# Patient Record
Sex: Male | Born: 1975 | Race: White | Hispanic: No | Marital: Single
Health system: Southern US, Community
[De-identification: ages and names within clinical notes are randomized; demographics above are authoritative.]

---

## 2010-10-12 ENCOUNTER — Inpatient Hospital Stay (INDEPENDENT_AMBULATORY_CARE_PROVIDER_SITE_OTHER)
Admission: RE | Admit: 2010-10-12 | Discharge: 2010-10-12 | Disposition: A | Discharge status: Home / Self Care | Payer: Managed Care, Other (non HMO) | Source: Ambulatory Visit | Attending: Emergency Medicine | Admitting: Emergency Medicine

## 2010-10-12 DIAGNOSIS — M899 Disorder of bone, unspecified: Secondary | ICD-10-CM

## 2010-10-12 DIAGNOSIS — M949 Disorder of cartilage, unspecified: Secondary | ICD-10-CM

## 2011-02-16 ENCOUNTER — Ambulatory Visit
Admission: RE | Admit: 2011-02-16 | Discharge: 2011-02-16 | Disposition: A | Discharge status: Home / Self Care | Payer: Managed Care, Other (non HMO) | Source: Ambulatory Visit | Attending: Internal Medicine | Admitting: Internal Medicine

## 2011-02-16 ENCOUNTER — Other Ambulatory Visit: Payer: Self-pay | Admitting: Internal Medicine

## 2011-02-16 DIAGNOSIS — M545 Low back pain: Secondary | ICD-10-CM

## 2011-02-16 IMAGING — CR DG LUMBAR SPINE COMPLETE 4+V
5 series · 5 of 5 positions shown · non-contrast
Comparison: None.

CLINICAL DATA: 35-year-old male with low back pain, right leg pain.

LUMBAR SPINE - COMPLETE 4+ VIEW

[t l-spine a.p.]
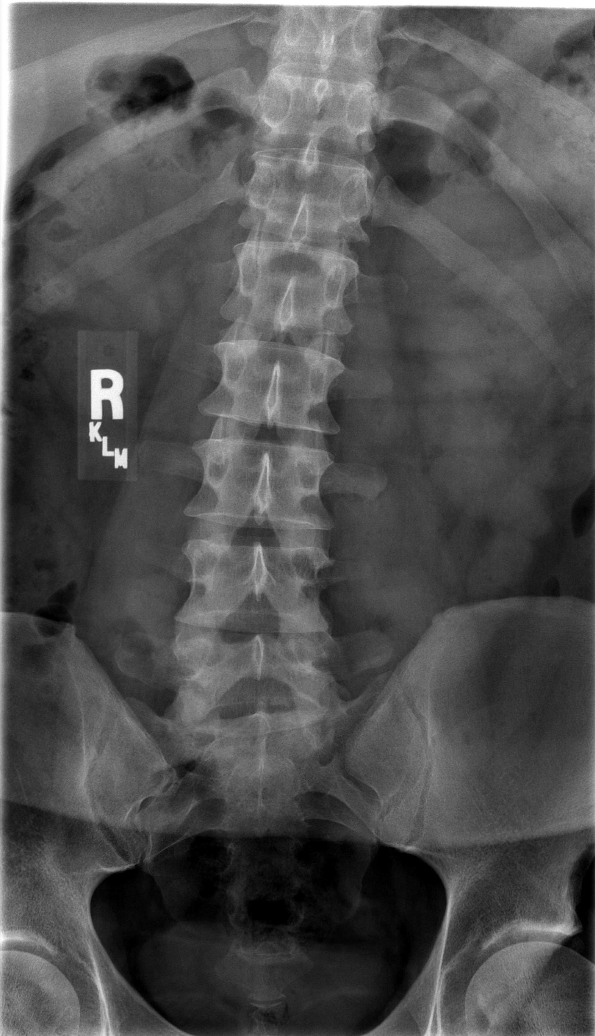

[t l-spine oblique exposure (1 of 2)]
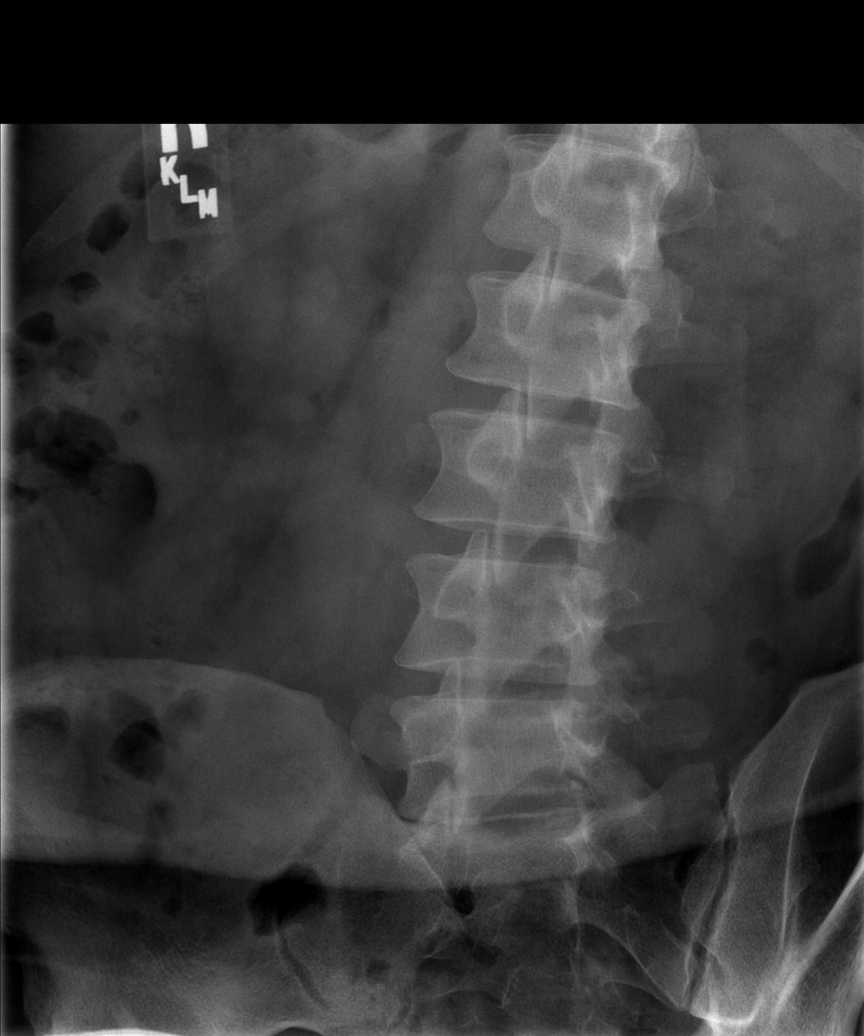

[t l-spine oblique exposure (2 of 2)]
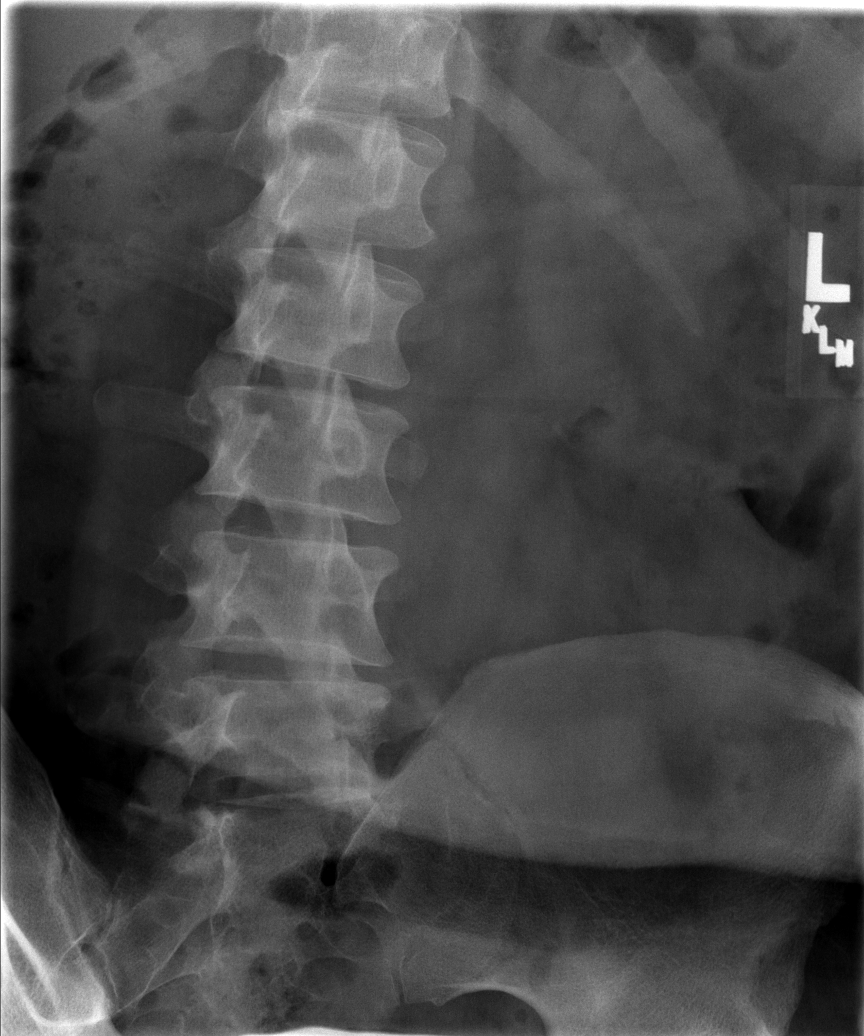

[t l-spine lat]
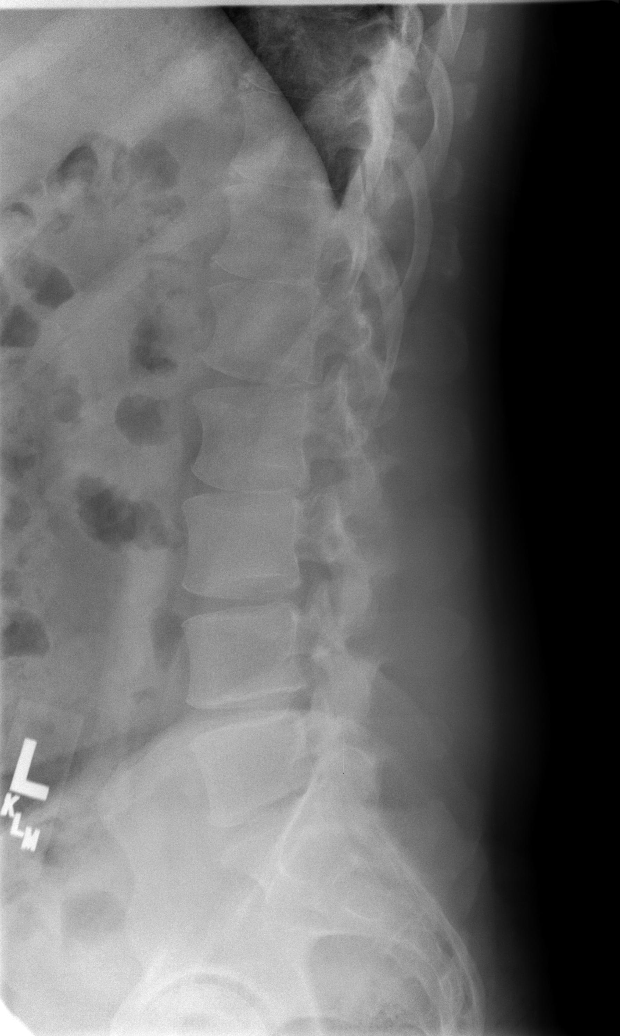

[t l-spine l5-s1 spot]
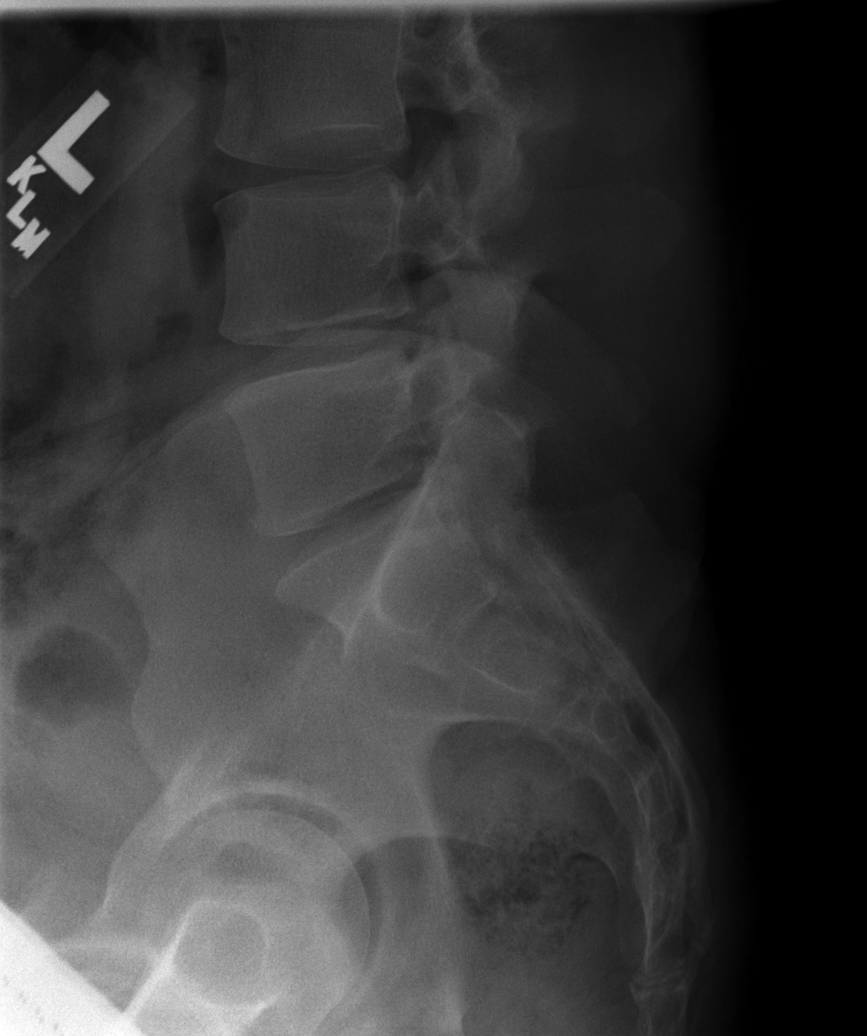

[5 of 5 positions shown; findings below may reference images not displayed]

FINDINGS: Normal lumbar segmentation. Bone mineralization is within
normal limits.  Normal lumbar vertebral height alignment.
Relatively preserved disc spaces.  No pars fracture.  Sacrum and SI
joints within normal limits.
IMPRESSION: Negative radiographic appearance of the lumbar spine.

## 2014-01-11 ENCOUNTER — Ambulatory Visit
Admission: RE | Admit: 2014-01-11 | Discharge: 2014-01-11 | Disposition: A | Discharge status: Home / Self Care | Payer: Managed Care, Other (non HMO) | Source: Ambulatory Visit | Attending: Internal Medicine | Admitting: Internal Medicine

## 2014-01-11 ENCOUNTER — Other Ambulatory Visit: Payer: Self-pay | Admitting: Internal Medicine

## 2014-01-11 DIAGNOSIS — M5489 Other dorsalgia: Secondary | ICD-10-CM

## 2015-09-24 ENCOUNTER — Other Ambulatory Visit: Payer: Self-pay | Admitting: Physical Medicine and Rehabilitation

## 2015-09-24 DIAGNOSIS — M549 Dorsalgia, unspecified: Secondary | ICD-10-CM

## 2015-10-03 ENCOUNTER — Other Ambulatory Visit: Payer: Self-pay | Admitting: Physical Medicine and Rehabilitation

## 2015-10-03 ENCOUNTER — Ambulatory Visit
Admission: RE | Admit: 2015-10-03 | Discharge: 2015-10-03 | Disposition: A | Discharge status: Home / Self Care | Payer: Managed Care, Other (non HMO) | Source: Ambulatory Visit | Attending: Physical Medicine and Rehabilitation | Admitting: Physical Medicine and Rehabilitation

## 2015-10-03 DIAGNOSIS — M549 Dorsalgia, unspecified: Secondary | ICD-10-CM

## 2015-10-03 MED ORDER — METHYLPREDNISOLONE ACETATE 40 MG/ML INJ SUSP (RADIOLOG
120.0000 mg | Freq: Once | INTRAMUSCULAR | Status: AC
  Administered 2015-10-03: 120 mg via EPIDURAL

## 2015-10-03 MED ORDER — IOPAMIDOL (ISOVUE-M 200) INJECTION 41%
1.0000 mL | Freq: Once | INTRAMUSCULAR | Status: AC
  Administered 2015-10-03: 1 mL via EPIDURAL

## 2015-10-03 NOTE — Discharge Instructions (Signed)
